# Patient Record
Sex: Female | Born: 1970 | Race: Black or African American | Hispanic: No | Marital: Single | Smoking: Never smoker
Health system: Southern US, Community
[De-identification: ages and names within clinical notes are randomized; demographics above are authoritative.]

## PROBLEM LIST (undated history)

## (undated) DIAGNOSIS — D649 Anemia, unspecified: Secondary | ICD-10-CM

## (undated) DIAGNOSIS — D219 Benign neoplasm of connective and other soft tissue, unspecified: Secondary | ICD-10-CM

---

## 2015-07-05 ENCOUNTER — Emergency Department (HOSPITAL_BASED_OUTPATIENT_CLINIC_OR_DEPARTMENT_OTHER)
Admission: EM | Admit: 2015-07-05 | Discharge: 2015-07-05 | Disposition: A | Discharge status: Home / Self Care | Payer: 59 | Attending: Emergency Medicine | Admitting: Emergency Medicine

## 2015-07-05 ENCOUNTER — Emergency Department (HOSPITAL_BASED_OUTPATIENT_CLINIC_OR_DEPARTMENT_OTHER): Payer: 59

## 2015-07-05 ENCOUNTER — Encounter (HOSPITAL_BASED_OUTPATIENT_CLINIC_OR_DEPARTMENT_OTHER): Payer: Self-pay

## 2015-07-05 DIAGNOSIS — R1031 Right lower quadrant pain: Secondary | ICD-10-CM | POA: Insufficient documentation

## 2015-07-05 DIAGNOSIS — R1032 Left lower quadrant pain: Secondary | ICD-10-CM | POA: Diagnosis not present

## 2015-07-05 DIAGNOSIS — R112 Nausea with vomiting, unspecified: Secondary | ICD-10-CM | POA: Diagnosis not present

## 2015-07-05 DIAGNOSIS — R109 Unspecified abdominal pain: Secondary | ICD-10-CM

## 2015-07-05 DIAGNOSIS — Z3202 Encounter for pregnancy test, result negative: Secondary | ICD-10-CM | POA: Diagnosis not present

## 2015-07-05 DIAGNOSIS — Z79899 Other long term (current) drug therapy: Secondary | ICD-10-CM | POA: Diagnosis not present

## 2015-07-05 DIAGNOSIS — F649 Gender identity disorder, unspecified: Secondary | ICD-10-CM | POA: Diagnosis not present

## 2015-07-05 DIAGNOSIS — R1030 Lower abdominal pain, unspecified: Secondary | ICD-10-CM | POA: Diagnosis present

## 2015-07-05 DIAGNOSIS — D72829 Elevated white blood cell count, unspecified: Secondary | ICD-10-CM | POA: Insufficient documentation

## 2015-07-05 DIAGNOSIS — Z86018 Personal history of other benign neoplasm: Secondary | ICD-10-CM | POA: Insufficient documentation

## 2015-07-05 HISTORY — DX: Benign neoplasm of connective and other soft tissue, unspecified: D21.9

## 2015-07-05 HISTORY — DX: Anemia, unspecified: D64.9

## 2015-07-05 LAB — COMPREHENSIVE METABOLIC PANEL
ALK PHOS: 60 U/L (ref 38–126)
ALT: 10 U/L — AB (ref 14–54)
AST: 16 U/L (ref 15–41)
Albumin: 3.8 g/dL (ref 3.5–5.0)
Anion gap: 8 (ref 5–15)
BILIRUBIN TOTAL: 0.2 mg/dL — AB (ref 0.3–1.2)
BUN: 11 mg/dL (ref 6–20)
CALCIUM: 8.9 mg/dL (ref 8.9–10.3)
CHLORIDE: 105 mmol/L (ref 101–111)
CO2: 25 mmol/L (ref 22–32)
CREATININE: 0.58 mg/dL (ref 0.44–1.00)
Glucose, Bld: 178 mg/dL — ABNORMAL HIGH (ref 65–99)
Potassium: 3.7 mmol/L (ref 3.5–5.1)
Sodium: 138 mmol/L (ref 135–145)
TOTAL PROTEIN: 7.5 g/dL (ref 6.5–8.1)

## 2015-07-05 LAB — CBC WITH DIFFERENTIAL/PLATELET
BASOS ABS: 0 10*3/uL (ref 0.0–0.1)
Basophils Relative: 0 %
EOS PCT: 0 %
Eosinophils Absolute: 0 10*3/uL (ref 0.0–0.7)
HEMATOCRIT: 31.6 % — AB (ref 36.0–46.0)
Hemoglobin: 9 g/dL — ABNORMAL LOW (ref 12.0–15.0)
LYMPHS ABS: 1 10*3/uL (ref 0.7–4.0)
LYMPHS PCT: 6 %
MCH: 22.8 pg — AB (ref 26.0–34.0)
MCHC: 28.5 g/dL — ABNORMAL LOW (ref 30.0–36.0)
MCV: 80 fL (ref 78.0–100.0)
Monocytes Absolute: 0.9 10*3/uL (ref 0.1–1.0)
Monocytes Relative: 6 %
NEUTROS ABS: 14.4 10*3/uL — AB (ref 1.7–7.7)
Neutrophils Relative %: 88 %
PLATELETS: 548 10*3/uL — AB (ref 150–400)
RBC: 3.95 MIL/uL (ref 3.87–5.11)
RDW: 15.9 % — ABNORMAL HIGH (ref 11.5–15.5)
WBC: 16.4 10*3/uL — AB (ref 4.0–10.5)

## 2015-07-05 LAB — URINALYSIS, ROUTINE W REFLEX MICROSCOPIC
Glucose, UA: NEGATIVE mg/dL
Ketones, ur: NEGATIVE mg/dL
Leukocytes, UA: NEGATIVE
Nitrite: NEGATIVE
PH: 5 (ref 5.0–8.0)
Protein, ur: 100 mg/dL — AB
SPECIFIC GRAVITY, URINE: 1.028 (ref 1.005–1.030)

## 2015-07-05 LAB — PREGNANCY, URINE: Preg Test, Ur: NEGATIVE

## 2015-07-05 LAB — URINE MICROSCOPIC-ADD ON

## 2015-07-05 LAB — LIPASE, BLOOD: LIPASE: 30 U/L (ref 11–51)

## 2015-07-05 MED ORDER — IOHEXOL 300 MG/ML  SOLN
25.0000 mL | Freq: Once | INTRAMUSCULAR | Status: AC | PRN
  Administered 2015-07-05: 25 mL via ORAL

## 2015-07-05 MED ORDER — IOHEXOL 300 MG/ML  SOLN
100.0000 mL | Freq: Once | INTRAMUSCULAR | Status: AC | PRN
  Administered 2015-07-05: 100 mL via INTRAVENOUS

## 2015-07-05 NOTE — ED Provider Notes (Signed)
CSN: [id number]     Arrival date & time 07/05/15  1618 History   First MD Initiated Contact with Patient 07/05/15 1658     Chief Complaint  Patient presents with  . Abdominal Pain     (Consider location/radiation/quality/duration/timing/severity/associated sxs/prior Treatment) HPI Comments: 45 year old female with no significant past medical history presents for lower abdominal pain. The patient reports that she ate lunch and then shortly after had acute onset of lower abdominal pain which was mostly cramping in nature. She reports she got very nauseous and vomited once. Denies ever having pain like this before. Denies any upper abdominal pain. No constipation or diarrhea. No fevers or chills. Reports feeling fine earlier in the day. She said she actually feels much better but still has some lower abdominal pain at this time.   Past Medical History  Diagnosis Date  . Fibroids   . Anemia    History reviewed. No pertinent past surgical history. No family history on file. Social History  Substance Use Topics  . Smoking status: Never Smoker   . Smokeless tobacco: None  . Alcohol Use: No   OB History    No data available     Review of Systems  Constitutional: Negative for fever, chills and fatigue.  HENT: Negative for congestion, sinus pressure and sore throat.   Eyes: Negative for pain and redness.  Respiratory: Negative for cough, chest tightness and shortness of breath.   Cardiovascular: Negative for chest pain and palpitations.  Gastrointestinal: Positive for nausea, vomiting (x1) and abdominal pain. Negative for diarrhea and constipation. Abdominal distention: lower abdominal.  Genitourinary: Negative for dysuria, urgency, hematuria and flank pain.  Musculoskeletal: Negative for myalgias and back pain.  Skin: Negative for rash.  Neurological: Negative for dizziness, seizures, weakness, numbness and headaches.  Hematological: Does not bruise/bleed easily.      Allergies   Codeine and Sulfa antibiotics  Home Medications   Prior to Admission medications   Medication Sig Start Date End Date Taking? Authorizing Provider  cholecalciferol (VITAMIN D) 1000 units tablet Take 1,000 Units by mouth daily.   Yes Historical Provider, MD  IRON CR PO Take by mouth.   Yes Historical Provider, MD  OMEPRAZOLE PO Take by mouth.   Yes Historical Provider, MD   BP 131/86 mmHg  Pulse 96  Temp(Src) 97.9 F (36.6 C) (Oral)  Resp 18  Ht 5\' 6"  (1.676 m)  Wt 325 lb (147.419 kg)  BMI 52.48 kg/m2  SpO2 100%  LMP 06/28/2015 Physical Exam  Constitutional: She is oriented to person, place, and time. She appears well-developed and well-nourished. No distress.  HENT:  Head: Normocephalic and atraumatic.  Right Ear: External ear normal.  Left Ear: External ear normal.  Nose: Nose normal.  Mouth/Throat: Oropharynx is clear and moist. No oropharyngeal exudate.  Eyes: EOM are normal. Pupils are equal, round, and reactive to light.  Neck: Normal range of motion. Neck supple.  Cardiovascular: Normal rate, regular rhythm, normal heart sounds and intact distal pulses.   No murmur heard. Pulmonary/Chest: Effort normal. No respiratory distress. She has no wheezes. She has no rales.  Abdominal: Soft. She exhibits no distension. There is tenderness (mild) in the right lower quadrant, suprapubic area and left lower quadrant.  Musculoskeletal: Normal range of motion. She exhibits no edema or tenderness.  Neurological: She is alert and oriented to person, place, and time.  Skin: Skin is warm and dry. No rash noted. She is not diaphoretic.  Vitals reviewed.   ED Course  Procedures (including critical care time) Labs Review Labs Reviewed  URINALYSIS, ROUTINE W REFLEX MICROSCOPIC (NOT AT [facility]) - Abnormal; Notable for the following:    Color, Urine AMBER (*)    APPearance CLOUDY (*)    Hgb urine dipstick LARGE (*)    Bilirubin Urine SMALL (*)    Protein, ur 100 (*)    All other  components within normal limits  CBC WITH DIFFERENTIAL/PLATELET - Abnormal; Notable for the following:    WBC 16.4 (*)    Hemoglobin 9.0 (*)    HCT 31.6 (*)    MCH 22.8 (*)    MCHC 28.5 (*)    RDW 15.9 (*)    Platelets 548 (*)    Neutro Abs 14.4 (*)    All other components within normal limits  COMPREHENSIVE METABOLIC PANEL - Abnormal; Notable for the following:    Glucose, Bld 178 (*)    ALT 10 (*)    Total Bilirubin 0.2 (*)    All other components within normal limits  URINE MICROSCOPIC-ADD ON - Abnormal; Notable for the following:    Squamous Epithelial / LPF 0-5 (*)    Bacteria, UA RARE (*)    All other components within normal limits  PREGNANCY, URINE  LIPASE, BLOOD    Imaging Review Ct Abdomen Pelvis W Contrast  07/05/2015  CLINICAL DATA:  45 year old female with lower abdominal pain today. Nausea and vomiting. EXAM: CT ABDOMEN AND PELVIS WITH CONTRAST TECHNIQUE: Multidetector CT imaging of the abdomen and pelvis was performed using the standard protocol following bolus administration of intravenous contrast. CONTRAST:  74mL OMNIPAQUE IOHEXOL 300 MG/ML SOLN, 123mL OMNIPAQUE IOHEXOL 300 MG/ML SOLN COMPARISON:  No priors. FINDINGS: Lower chest:  Unremarkable. Hepatobiliary: 2.1 x 1.3 cm low-attenuation lesion in segment 8 of the liver is compatible with a simple cyst. No other suspicious hepatic lesions. No intra or extrahepatic biliary ductal dilatation. Gallbladder is remarkable for some small high attenuation structures in the neck of the gallbladder, likely biliary sludge balls or noncalcified stones. No evidence of acute cholecystitis at this time. Pancreas: No pancreatic mass. No pancreatic ductal dilatation. No pancreatic or peripancreatic fluid or inflammatory changes. Spleen: Unremarkable. Adrenals/Urinary Tract: Bilateral adrenal glands and bilateral kidneys are normal in appearance. No hydroureteronephrosis. Urinary bladder is normal in appearance. Stomach/Bowel: Normal  appearance of the stomach. No pathologic dilatation of small bowel or colon. Numerous colonic diverticulae are noted, without surrounding inflammatory changes to suggest an acute diverticulitis at this time. Normal appendix. Vascular/Lymphatic: No significant atherosclerotic calcifications, aneurysm or dissection identified in the abdominal or pelvic vasculature. No lymphadenopathy noted in the abdomen or pelvis. Reproductive: Uterus is enlarged and heterogeneous in appearance with multiple lesions, the largest of which is exophytic off the left side of the uterine fundus measuring approximately 7.5 cm in diameter, presumably multiple fibroids. Ovaries are unremarkable in appearance. Other: No significant volume of ascites.  No pneumoperitoneum. Musculoskeletal: There are no aggressive appearing lytic or blastic lesions noted in the visualized portions of the skeleton. IMPRESSION: 1. No acute findings in the abdomen or pelvis to account for the patient's symptoms. 2. Fibroid uterus, as above. 3. Normal appendix. 4. Colonic diverticulosis without evidence of acute diverticulitis at this time. 5. Biliary sludge balls or noncalcified gallstones in the neck of the gallbladder. No current findings to suggest an acute cholecystitis at this time. 6. Additional incidental findings, as above. Electronically Signed   By: Vinnie Langton M.D.   On: 07/05/2015 19:24   I have personally  reviewed and evaluated these images and lab results as part of my medical decision-making.   EKG Interpretation None      MDM  Patient was seen and evaluated in stable condition. Labs with leukocytosis but otherwise unremarkable. CT negative for acute process. Patient felt better without intervention as she refused pain or nausea medicine. She was able to tolerate by mouth without difficulty. She reported feeling up for discharge. She was discharged home in stable condition with strict return precautions and instructions to follow up  with her primary care physician. Final diagnoses:  Abdominal pain, unspecified abdominal location    1. Lower abdominal pain, unknown cause    Harvel Quale, MD 07/06/15 [phone]

## 2015-07-05 NOTE — ED Notes (Signed)
Pt remains in room awaiting clothes for discharge

## 2015-07-05 NOTE — ED Notes (Signed)
MD at bedside. 

## 2015-07-05 NOTE — ED Notes (Signed)
Sudden onset lower abd pain, vomited x 1 en route-NAD

## 2015-07-05 NOTE — Discharge Instructions (Signed)
You were seen today for your abdominal pain and episode of vomiting. The exact cause is unclear at this time. Follow-up with her primary care physician for reevaluation as further workup and evaluation may be needed with more testing outpatient. Return with sudden worsening of symptoms or inability to eat or drink.  Abdominal Pain, Adult Many things can cause abdominal pain. Usually, abdominal pain is not caused by a disease and will improve without treatment. It can often be observed and treated at home. Your health care provider will do a physical exam and possibly order blood tests and X-rays to help determine the seriousness of your pain. However, in many cases, more time must pass before a clear cause of the pain can be found. Before that point, your health care provider may not know if you need more testing or further treatment. HOME CARE INSTRUCTIONS Monitor your abdominal pain for any changes. The following actions may help to alleviate any discomfort you are experiencing:  Only take over-the-counter or prescription medicines as directed by your health care provider.  Do not take laxatives unless directed to do so by your health care provider.  Try a clear liquid diet (broth, tea, or water) as directed by your health care provider. Slowly move to a bland diet as tolerated. SEEK MEDICAL CARE IF:  You have unexplained abdominal pain.  You have abdominal pain associated with nausea or diarrhea.  You have pain when you urinate or have a bowel movement.  You experience abdominal pain that wakes you in the night.  You have abdominal pain that is worsened or improved by eating food.  You have abdominal pain that is worsened with eating fatty foods.  You have a fever. SEEK IMMEDIATE MEDICAL CARE IF:  Your pain does not go away within 2 hours.  You keep throwing up (vomiting).  Your pain is felt only in portions of the abdomen, such as the right side or the left lower portion of the  abdomen.  You pass bloody or black tarry stools. MAKE SURE YOU:  Understand these instructions.  Will watch your condition.  Will get help right away if you are not doing well or get worse.   This information is not intended to replace advice given to you by your health care provider. Make sure you discuss any questions you have with your health care provider.   Document Released: 03/12/2005 Document Revised: 02/21/2015 Document Reviewed: 02/09/2013 Elsevier Interactive Patient Education [employer].

## 2015-07-05 NOTE — ED Notes (Signed)
Unable to Leon kit for ED WR

## 2015-07-05 NOTE — ED Notes (Signed)
Patient transported to CT 

## 2016-09-01 IMAGING — CT CT ABD-PELV W/ CM
2 of 5 series · 16 of 46 positions shown, 18 images · IV contrast (APPLIED)
Comparison: No priors.

CLINICAL DATA: 44-year-old female with lower abdominal pain today.
Nausea and vomiting.

EXAM:
CT ABDOMEN AND PELVIS WITH CONTRAST
TECHNIQUE: Multidetector CT imaging of the abdomen and pelvis was performed
using the standard protocol following bolus administration of
intravenous contrast.
CONTRAST:  25mL OMNIPAQUE IOHEXOL 300 MG/ML SOLN, 100mL OMNIPAQUE
IOHEXOL 300 MG/ML SOLN

[Series 2: axial st · axial · 0.98mm/px · z∈[-500,-76]mm · 13 of 95 slices shown, 15 images]
[im 5/95  soft-tissue]
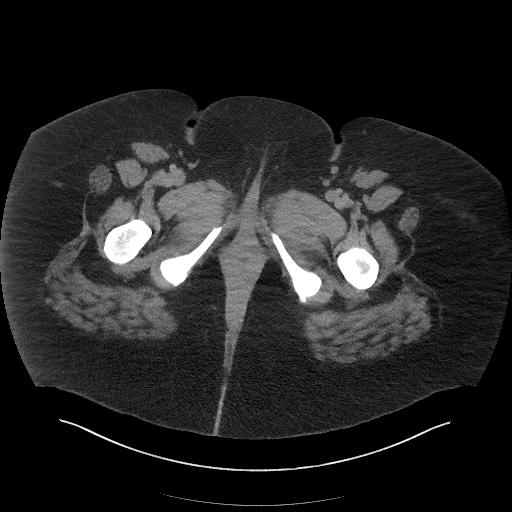
[im 5/95  bone]
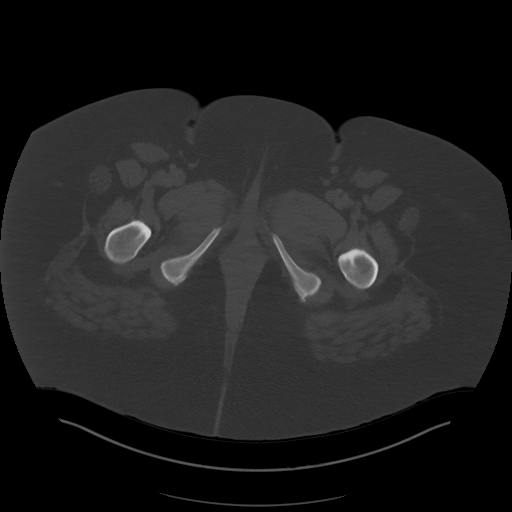
[im 15/95  soft-tissue]
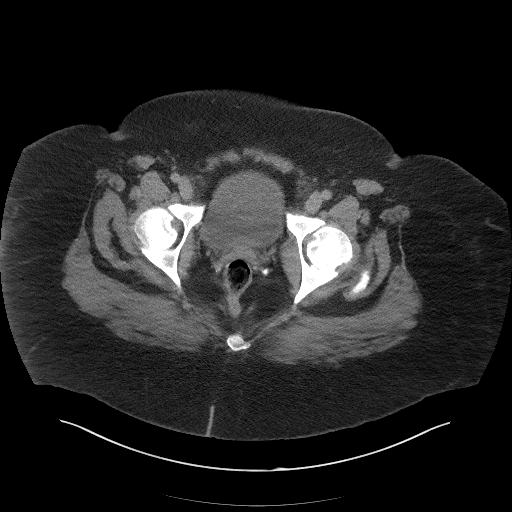
[im 20/95  soft-tissue]
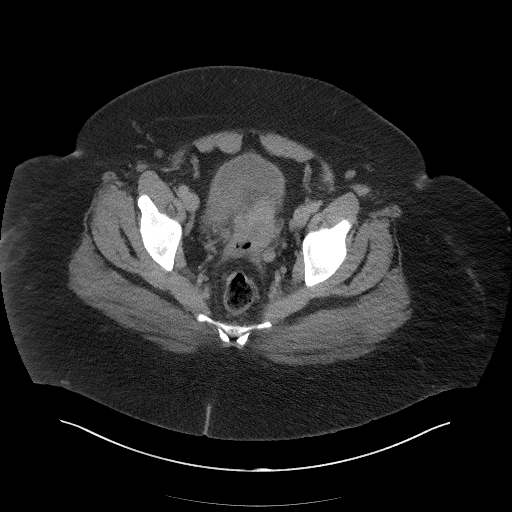
[im 25/95  soft-tissue]
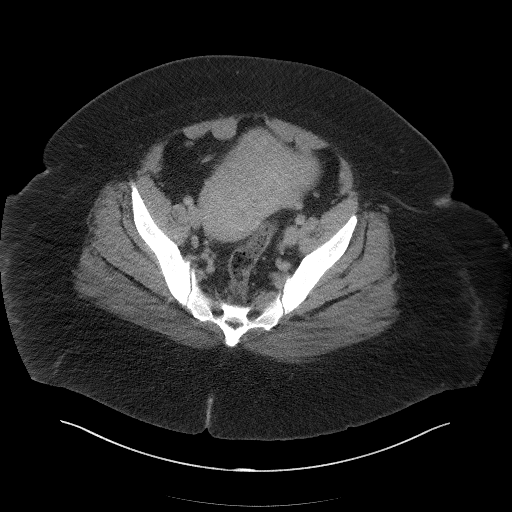
[im 35/95  soft-tissue]
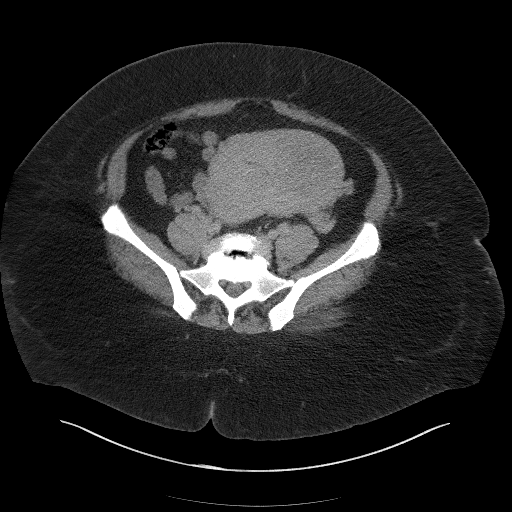
[im 40/95  soft-tissue]
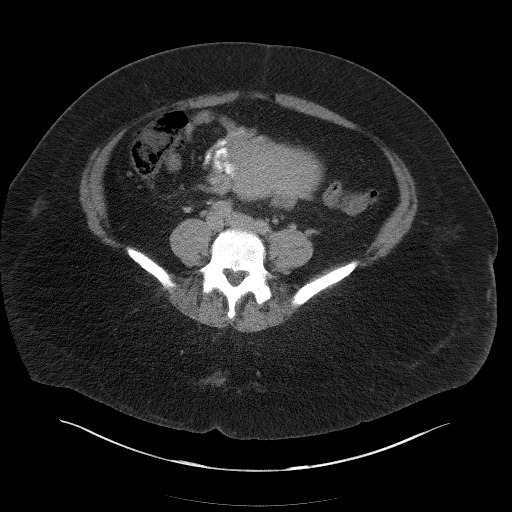
[im 50/95  soft-tissue]
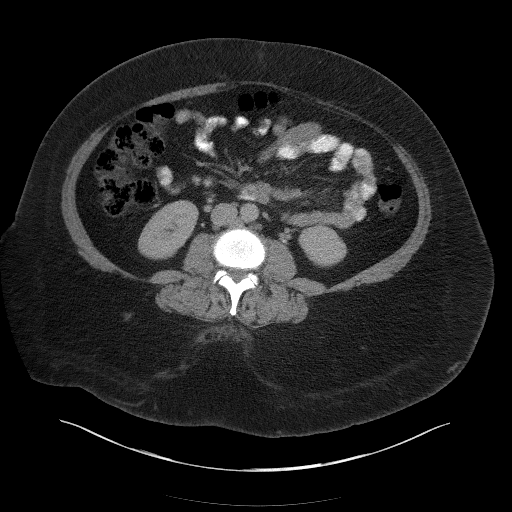
[im 55/95  soft-tissue]
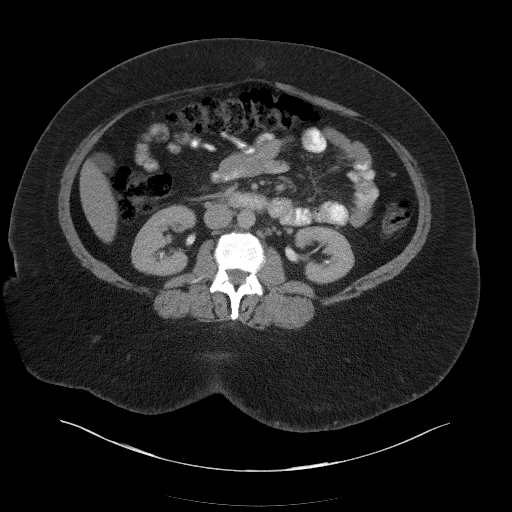
[im 60/95  soft-tissue]
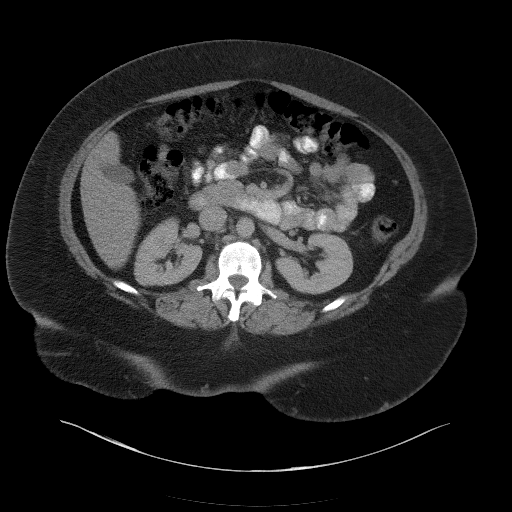
[im 60/95  bone]
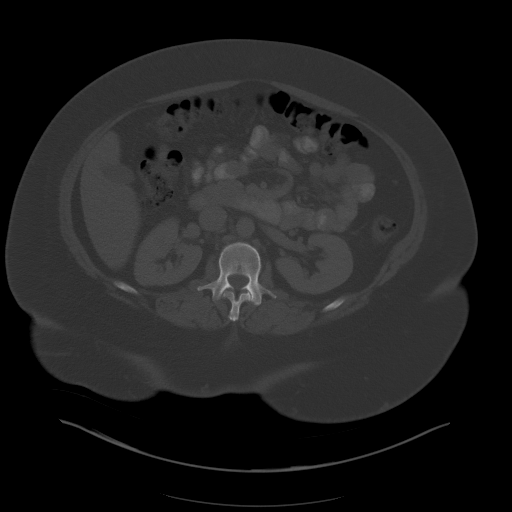
[im 70/95  soft-tissue]
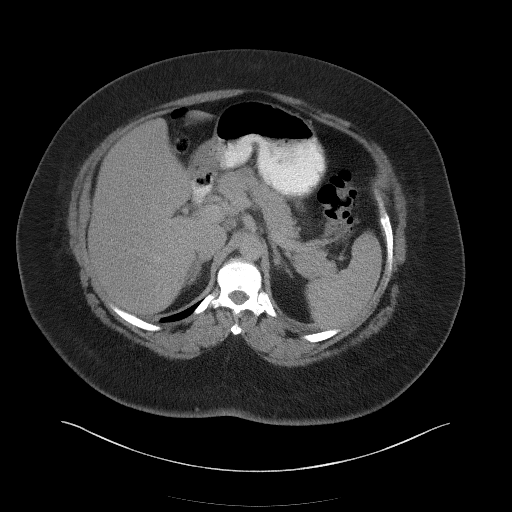
[im 75/95  soft-tissue]
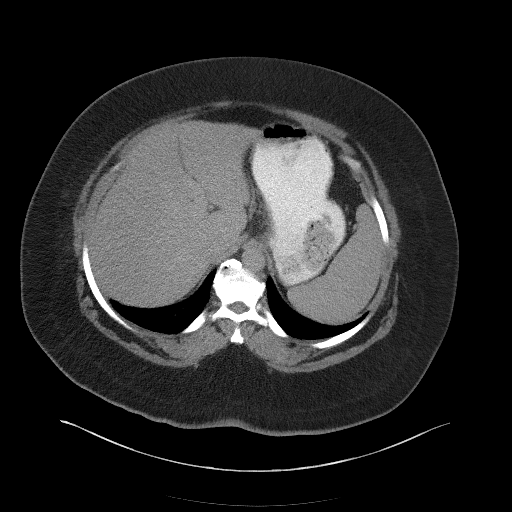
[im 80/95  soft-tissue]
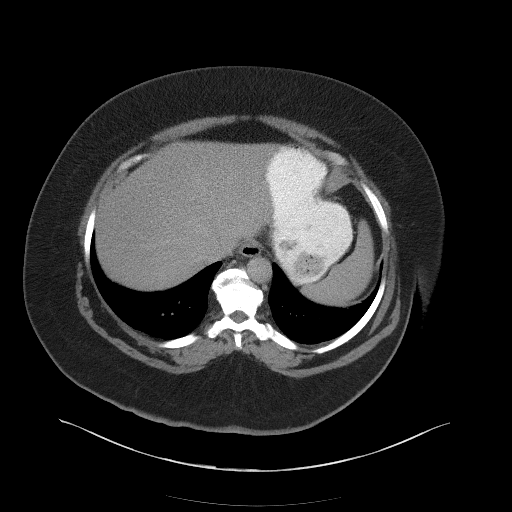
[im 90/95  soft-tissue]
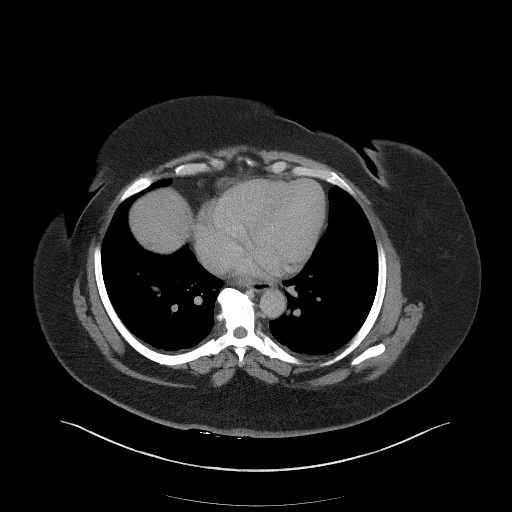

[Series 5: coronal st · coronal · 0.99mm/px · 3 of 107 slices shown]
[im 36/107  soft-tissue]
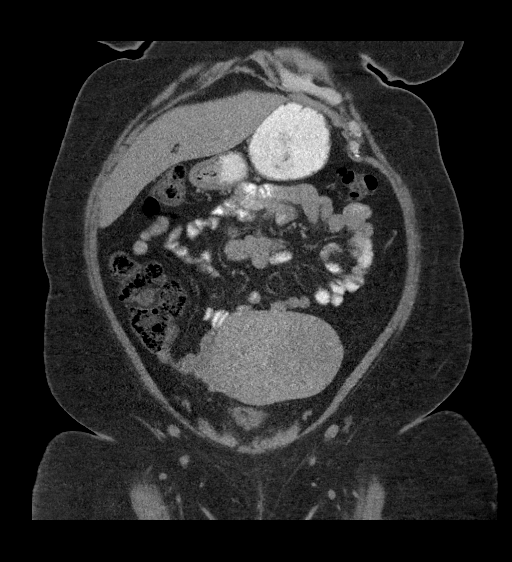
[im 48/107  soft-tissue]
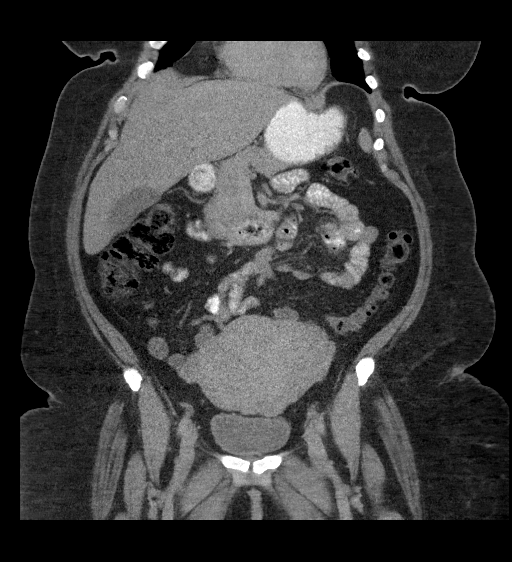
[im 59/107  soft-tissue]
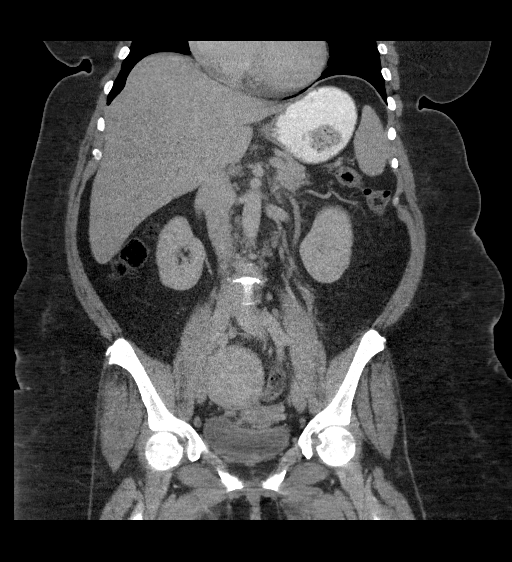

[16 of 46 positions shown; findings below may reference images not displayed]

FINDINGS: Lower chest:  Unremarkable.

Hepatobiliary: 2.1 x 1.3 cm low-attenuation lesion in segment 8 of
the liver is compatible with a simple cyst. No other suspicious
hepatic lesions. No intra or extrahepatic biliary ductal dilatation.
Gallbladder is remarkable for some small high attenuation structures
in the neck of the gallbladder, likely biliary sludge balls or
noncalcified stones. No evidence of acute cholecystitis at this
time.

Pancreas: No pancreatic mass. No pancreatic ductal dilatation. No
pancreatic or peripancreatic fluid or inflammatory changes.

Spleen: Unremarkable.

Adrenals/Urinary Tract: Bilateral adrenal glands and bilateral
kidneys are normal in appearance. No hydroureteronephrosis. Urinary
bladder is normal in appearance.

Stomach/Bowel: Normal appearance of the stomach. No pathologic
dilatation of small bowel or colon. Numerous colonic diverticulae
are noted, without surrounding inflammatory changes to suggest an
acute diverticulitis at this time. Normal appendix.

Vascular/Lymphatic: No significant atherosclerotic calcifications,
aneurysm or dissection identified in the abdominal or pelvic
vasculature. No lymphadenopathy noted in the abdomen or pelvis.

Reproductive: Uterus is enlarged and heterogeneous in appearance
with multiple lesions, the largest of which is exophytic off the
left side of the uterine fundus measuring approximately 7.5 cm in
diameter, presumably multiple fibroids. Ovaries are unremarkable in
appearance.

Other: No significant volume of ascites.  No pneumoperitoneum.

Musculoskeletal: There are no aggressive appearing lytic or blastic
lesions noted in the visualized portions of the skeleton.
IMPRESSION: 1. No acute findings in the abdomen or pelvis to account for the
patient's symptoms.
2. Fibroid uterus, as above.
3. Normal appendix.
4. Colonic diverticulosis without evidence of acute diverticulitis
at this time.
5. Biliary sludge balls or noncalcified gallstones in the neck of
the gallbladder. No current findings to suggest an acute
cholecystitis at this time.
6. Additional incidental findings, as above.
